# Patient Record
Sex: Female | Born: 1935 | Race: White | Hispanic: No | Smoking: Former smoker
Health system: Southern US, Community
[De-identification: ages and names within clinical notes are randomized; demographics above are authoritative.]

## PROBLEM LIST (undated history)

## (undated) DIAGNOSIS — I8390 Asymptomatic varicose veins of unspecified lower extremity: Secondary | ICD-10-CM

## (undated) HISTORY — DX: Asymptomatic varicose veins of unspecified lower extremity: I83.90

## (undated) HISTORY — PX: FRACTURE SURGERY: SHX138

## (undated) HISTORY — PX: JOINT REPLACEMENT: SHX530

---

## 1998-05-05 ENCOUNTER — Ambulatory Visit (HOSPITAL_COMMUNITY): Admission: RE | Admit: 1998-05-05 | Discharge: 1998-05-05 | Payer: Self-pay | Admitting: Obstetrics and Gynecology

## 1999-05-04 ENCOUNTER — Ambulatory Visit (HOSPITAL_COMMUNITY): Admission: RE | Admit: 1999-05-04 | Discharge: 1999-05-04 | Payer: Self-pay | Admitting: *Deleted

## 1999-05-04 ENCOUNTER — Encounter: Payer: Self-pay | Admitting: *Deleted

## 1999-07-07 ENCOUNTER — Ambulatory Visit (HOSPITAL_COMMUNITY): Admission: RE | Admit: 1999-07-07 | Discharge: 1999-07-07 | Payer: Self-pay | Admitting: *Deleted

## 1999-07-08 ENCOUNTER — Encounter: Payer: Self-pay | Admitting: *Deleted

## 1999-08-05 ENCOUNTER — Encounter (INDEPENDENT_AMBULATORY_CARE_PROVIDER_SITE_OTHER): Payer: Self-pay | Admitting: Specialist

## 1999-08-05 ENCOUNTER — Ambulatory Visit (HOSPITAL_COMMUNITY): Admission: RE | Admit: 1999-08-05 | Discharge: 1999-08-05 | Payer: Self-pay | Admitting: Gastroenterology

## 2000-05-25 ENCOUNTER — Ambulatory Visit (HOSPITAL_COMMUNITY): Admission: RE | Admit: 2000-05-25 | Discharge: 2000-05-25 | Payer: Self-pay

## 2000-07-12 ENCOUNTER — Encounter: Payer: Self-pay | Admitting: *Deleted

## 2000-07-12 ENCOUNTER — Encounter: Admission: RE | Admit: 2000-07-12 | Discharge: 2000-07-12 | Payer: Self-pay | Admitting: *Deleted

## 2001-01-10 ENCOUNTER — Other Ambulatory Visit: Admission: RE | Admit: 2001-01-10 | Discharge: 2001-01-10 | Payer: Self-pay | Admitting: Obstetrics and Gynecology

## 2001-04-18 ENCOUNTER — Encounter: Payer: Self-pay | Admitting: Obstetrics and Gynecology

## 2001-04-18 ENCOUNTER — Encounter: Admission: RE | Admit: 2001-04-18 | Discharge: 2001-04-18 | Payer: Self-pay | Admitting: Obstetrics and Gynecology

## 2001-05-29 ENCOUNTER — Encounter: Payer: Self-pay | Admitting: Internal Medicine

## 2001-05-29 ENCOUNTER — Encounter: Admission: RE | Admit: 2001-05-29 | Discharge: 2001-05-29 | Payer: Self-pay | Admitting: Unknown Physician Specialty

## 2002-02-01 ENCOUNTER — Other Ambulatory Visit: Admission: RE | Admit: 2002-02-01 | Discharge: 2002-02-01 | Payer: Self-pay | Admitting: Obstetrics and Gynecology

## 2002-05-14 ENCOUNTER — Encounter: Payer: Self-pay | Admitting: Internal Medicine

## 2002-05-14 ENCOUNTER — Encounter: Admission: RE | Admit: 2002-05-14 | Discharge: 2002-05-14 | Payer: Self-pay | Admitting: Internal Medicine

## 2002-05-17 ENCOUNTER — Encounter: Payer: Self-pay | Admitting: Gastroenterology

## 2002-05-17 ENCOUNTER — Ambulatory Visit (HOSPITAL_COMMUNITY): Admission: RE | Admit: 2002-05-17 | Discharge: 2002-05-17 | Payer: Self-pay | Admitting: Internal Medicine

## 2002-05-29 ENCOUNTER — Ambulatory Visit (HOSPITAL_COMMUNITY): Admission: RE | Admit: 2002-05-29 | Discharge: 2002-05-29 | Payer: Self-pay | Admitting: Neurosurgery

## 2002-05-29 ENCOUNTER — Encounter: Payer: Self-pay | Admitting: Neurosurgery

## 2002-06-03 ENCOUNTER — Encounter: Payer: Self-pay | Admitting: Neurosurgery

## 2002-06-03 ENCOUNTER — Ambulatory Visit (HOSPITAL_COMMUNITY): Admission: RE | Admit: 2002-06-03 | Discharge: 2002-06-03 | Payer: Self-pay | Admitting: Neurosurgery

## 2002-08-06 ENCOUNTER — Ambulatory Visit (HOSPITAL_COMMUNITY): Admission: RE | Admit: 2002-08-06 | Discharge: 2002-08-06 | Payer: Self-pay | Admitting: Unknown Physician Specialty

## 2003-07-28 ENCOUNTER — Encounter: Admission: RE | Admit: 2003-07-28 | Discharge: 2003-08-11 | Payer: Self-pay | Admitting: Internal Medicine

## 2003-08-08 ENCOUNTER — Ambulatory Visit (HOSPITAL_COMMUNITY): Admission: RE | Admit: 2003-08-08 | Discharge: 2003-08-08 | Payer: Self-pay | Admitting: Internal Medicine

## 2003-08-08 ENCOUNTER — Other Ambulatory Visit: Admission: RE | Admit: 2003-08-08 | Discharge: 2003-08-08 | Payer: Self-pay | Admitting: Internal Medicine

## 2003-08-14 ENCOUNTER — Encounter: Payer: Self-pay | Admitting: Internal Medicine

## 2003-08-14 ENCOUNTER — Encounter: Admission: RE | Admit: 2003-08-14 | Discharge: 2003-08-14 | Payer: Self-pay | Admitting: Internal Medicine

## 2004-08-18 ENCOUNTER — Encounter: Admission: RE | Admit: 2004-08-18 | Discharge: 2004-08-18 | Payer: Self-pay | Admitting: Internal Medicine

## 2004-09-23 ENCOUNTER — Ambulatory Visit (HOSPITAL_COMMUNITY): Admission: RE | Admit: 2004-09-23 | Discharge: 2004-09-23 | Payer: Self-pay | Admitting: Gastroenterology

## 2004-10-17 ENCOUNTER — Encounter: Admission: RE | Admit: 2004-10-17 | Discharge: 2004-10-17 | Payer: Self-pay | Admitting: Internal Medicine

## 2004-11-01 ENCOUNTER — Encounter: Admission: RE | Admit: 2004-11-01 | Discharge: 2004-11-01 | Payer: Self-pay | Admitting: Neurosurgery

## 2004-11-24 ENCOUNTER — Inpatient Hospital Stay (HOSPITAL_COMMUNITY): Admission: RE | Admit: 2004-11-24 | Discharge: 2004-11-28 | Payer: Self-pay | Admitting: Orthopedic Surgery

## 2005-02-14 ENCOUNTER — Encounter (INDEPENDENT_AMBULATORY_CARE_PROVIDER_SITE_OTHER): Payer: Self-pay | Admitting: *Deleted

## 2005-02-14 ENCOUNTER — Inpatient Hospital Stay (HOSPITAL_COMMUNITY): Admission: RE | Admit: 2005-02-14 | Discharge: 2005-02-18 | Payer: Self-pay | Admitting: Orthopedic Surgery

## 2005-02-14 ENCOUNTER — Ambulatory Visit: Payer: Self-pay | Admitting: Physical Medicine & Rehabilitation

## 2005-02-18 ENCOUNTER — Inpatient Hospital Stay
Admission: RE | Admit: 2005-02-18 | Discharge: 2005-02-26 | Payer: Self-pay | Admitting: Physical Medicine & Rehabilitation

## 2005-04-23 IMAGING — CR DG PELVIS 1-2V
1 series · 1 of 1 positions shown · non-contrast
Comparison: none

CLINICAL DATA: Osteoarthritis, right hip arthroplasty.
 PELVIS ONE VIEW:
 A view of the pelvis shows right total hip replacement in good position.  Degenerative changes in the left hip are noted.

[view not recorded]
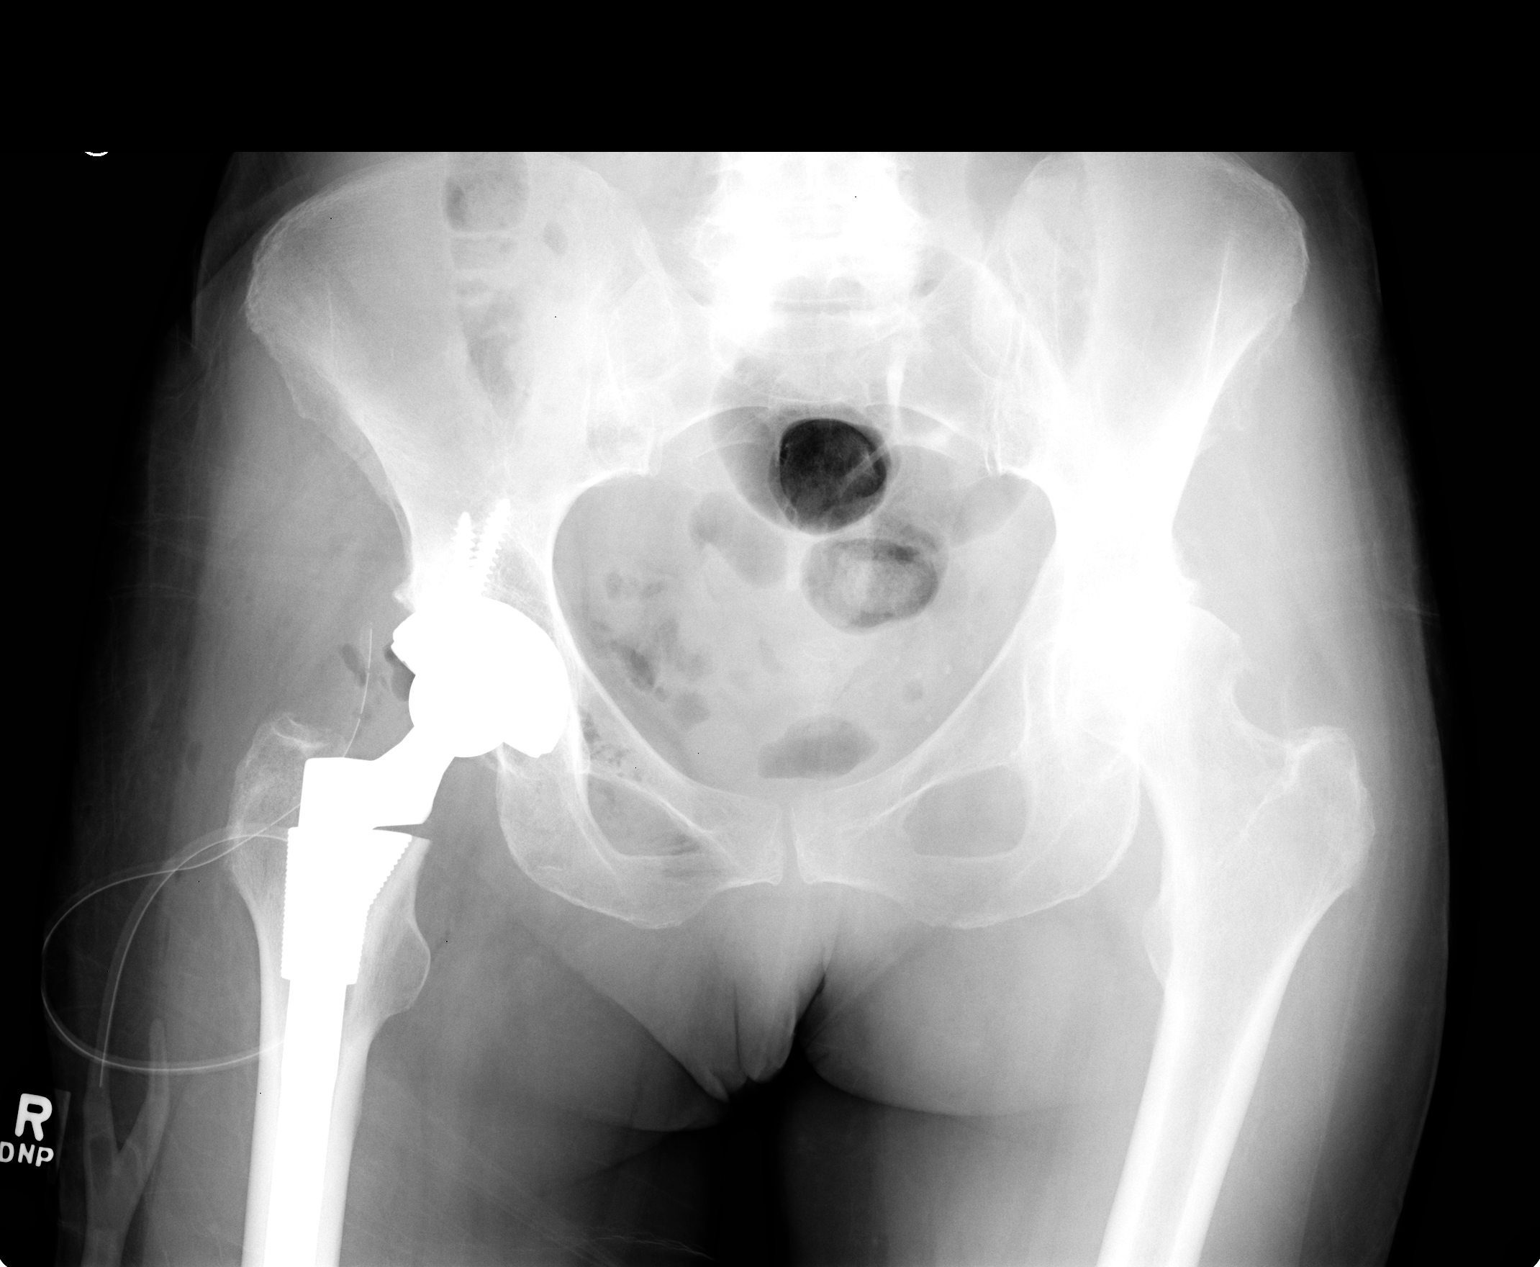

[1 of 1 positions shown; findings below may reference images not displayed]

IMPRESSION: Right total hip replacement in good position.

## 2005-08-10 ENCOUNTER — Other Ambulatory Visit: Admission: RE | Admit: 2005-08-10 | Discharge: 2005-08-10 | Payer: Self-pay | Admitting: Internal Medicine

## 2005-08-11 ENCOUNTER — Encounter: Admission: RE | Admit: 2005-08-11 | Discharge: 2005-08-11 | Payer: Self-pay | Admitting: Internal Medicine

## 2005-08-23 ENCOUNTER — Encounter: Admission: RE | Admit: 2005-08-23 | Discharge: 2005-08-23 | Payer: Self-pay | Admitting: Internal Medicine

## 2006-08-18 ENCOUNTER — Encounter: Admission: RE | Admit: 2006-08-18 | Discharge: 2006-08-18 | Payer: Self-pay | Admitting: *Deleted

## 2006-09-05 ENCOUNTER — Encounter: Admission: RE | Admit: 2006-09-05 | Discharge: 2006-09-05 | Payer: Self-pay | Admitting: Internal Medicine

## 2006-09-08 ENCOUNTER — Encounter: Admission: RE | Admit: 2006-09-08 | Discharge: 2006-09-08 | Payer: Self-pay | Admitting: Internal Medicine

## 2006-09-11 ENCOUNTER — Inpatient Hospital Stay (HOSPITAL_COMMUNITY): Admission: RE | Admit: 2006-09-11 | Discharge: 2006-09-14 | Payer: Self-pay | Admitting: Orthopedic Surgery

## 2006-09-12 ENCOUNTER — Ambulatory Visit: Payer: Self-pay | Admitting: Physical Medicine & Rehabilitation

## 2007-07-13 ENCOUNTER — Encounter: Admission: RE | Admit: 2007-07-13 | Discharge: 2007-07-13 | Payer: Self-pay | Admitting: Neurosurgery

## 2007-09-11 ENCOUNTER — Encounter: Admission: RE | Admit: 2007-09-11 | Discharge: 2007-09-11 | Payer: Self-pay | Admitting: Internal Medicine

## 2008-02-14 ENCOUNTER — Emergency Department (HOSPITAL_COMMUNITY): Admission: EM | Admit: 2008-02-14 | Discharge: 2008-02-14 | Payer: Self-pay | Admitting: Family Medicine

## 2008-09-12 ENCOUNTER — Encounter: Admission: RE | Admit: 2008-09-12 | Discharge: 2008-09-12 | Payer: Self-pay

## 2008-09-18 ENCOUNTER — Encounter: Admission: RE | Admit: 2008-09-18 | Discharge: 2008-09-18 | Payer: Self-pay | Admitting: Internal Medicine

## 2008-09-19 ENCOUNTER — Encounter: Admission: RE | Admit: 2008-09-19 | Discharge: 2008-09-19 | Payer: Self-pay | Admitting: Neurosurgery

## 2009-08-26 ENCOUNTER — Inpatient Hospital Stay (HOSPITAL_COMMUNITY): Admission: EM | Admit: 2009-08-26 | Discharge: 2009-08-30 | Payer: Self-pay | Admitting: Emergency Medicine

## 2009-08-27 ENCOUNTER — Encounter (INDEPENDENT_AMBULATORY_CARE_PROVIDER_SITE_OTHER): Payer: Self-pay | Admitting: Internal Medicine

## 2009-12-01 ENCOUNTER — Encounter: Admission: RE | Admit: 2009-12-01 | Discharge: 2009-12-01 | Payer: Self-pay | Admitting: Internal Medicine

## 2010-08-19 ENCOUNTER — Ambulatory Visit (HOSPITAL_COMMUNITY): Admission: RE | Admit: 2010-08-19 | Discharge: 2010-08-19 | Payer: Self-pay | Admitting: Gastroenterology

## 2010-12-02 ENCOUNTER — Encounter
Admission: RE | Admit: 2010-12-02 | Discharge: 2010-12-02 | Payer: Self-pay | Source: Home / Self Care | Admitting: Internal Medicine

## 2010-12-23 ENCOUNTER — Other Ambulatory Visit
Admission: RE | Admit: 2010-12-23 | Discharge: 2010-12-23 | Payer: Self-pay | Source: Home / Self Care | Admitting: Obstetrics and Gynecology

## 2011-01-15 ENCOUNTER — Encounter: Payer: Self-pay | Admitting: Neurosurgery

## 2011-01-16 ENCOUNTER — Encounter: Payer: Self-pay | Admitting: Internal Medicine

## 2011-01-17 ENCOUNTER — Encounter: Payer: Self-pay | Admitting: Internal Medicine

## 2011-04-01 LAB — BASIC METABOLIC PANEL
BUN: 4 mg/dL — ABNORMAL LOW (ref 6–23)
BUN: 5 mg/dL — ABNORMAL LOW (ref 6–23)
BUN: 6 mg/dL (ref 6–23)
BUN: 9 mg/dL (ref 6–23)
CO2: 30 mEq/L (ref 19–32)
CO2: 31 mEq/L (ref 19–32)
CO2: 31 mEq/L (ref 19–32)
CO2: 32 mEq/L (ref 19–32)
CO2: 32 mEq/L (ref 19–32)
Calcium: 7.9 mg/dL — ABNORMAL LOW (ref 8.4–10.5)
Calcium: 8.1 mg/dL — ABNORMAL LOW (ref 8.4–10.5)
Calcium: 8.1 mg/dL — ABNORMAL LOW (ref 8.4–10.5)
Chloride: 97 mEq/L (ref 96–112)
Chloride: 97 mEq/L (ref 96–112)
Creatinine, Ser: 0.54 mg/dL (ref 0.4–1.2)
Creatinine, Ser: 0.57 mg/dL (ref 0.4–1.2)
Creatinine, Ser: 0.58 mg/dL (ref 0.4–1.2)
Creatinine, Ser: 0.61 mg/dL (ref 0.4–1.2)
Creatinine, Ser: 0.64 mg/dL (ref 0.4–1.2)
GFR calc Af Amer: >60 mL/min (ref >60)
GFR calc non Af Amer: >60 mL/min (ref >60)
Glucose, Bld: 131 mg/dL — ABNORMAL HIGH (ref 70–99)
Glucose, Bld: 138 mg/dL — ABNORMAL HIGH (ref 70–99)
Glucose, Bld: 156 mg/dL — ABNORMAL HIGH (ref 70–99)
Sodium: 137 mEq/L (ref 135–145)

## 2011-04-01 LAB — CBC
HCT: 25.1 % — ABNORMAL LOW (ref 36.0–46.0)
Hemoglobin: 10.1 g/dL — ABNORMAL LOW (ref 12.0–15.0)
MCHC: 34.4 g/dL (ref 30.0–36.0)
MCHC: 34.4 g/dL (ref 30.0–36.0)
MCV: 93.4 fL (ref 78.0–100.0)
MCV: 93.9 fL (ref 78.0–100.0)
Platelets: 148 10*3/uL — ABNORMAL LOW (ref 150–400)
Platelets: 158 10*3/uL (ref 150–400)
RBC: 3.17 MIL/uL — ABNORMAL LOW (ref 3.87–5.11)
RDW: 13.7 % (ref 11.5–15.5)
RDW: 13.8 % (ref 11.5–15.5)
WBC: 5.5 10*3/uL (ref 4.0–10.5)

## 2011-04-01 LAB — MAGNESIUM: Magnesium: 2.1 mg/dL (ref 1.5–2.5)

## 2011-04-02 LAB — CROSSMATCH: ABO/RH(D): A POS

## 2011-04-02 LAB — URINALYSIS, ROUTINE W REFLEX MICROSCOPIC
Glucose, UA: NEGATIVE mg/dL
Hgb urine dipstick: NEGATIVE
Protein, ur: NEGATIVE mg/dL
Specific Gravity, Urine: 1.014 (ref 1.005–1.030)
Urobilinogen, UA: 0.2 mg/dL (ref 0.0–1.0)

## 2011-04-02 LAB — SAMPLE TO BLOOD BANK

## 2011-04-02 LAB — CBC
HCT: 39.1 % (ref 36.0–46.0)
Hemoglobin: 13.1 g/dL (ref 12.0–15.0)
MCHC: 33.5 g/dL (ref 30.0–36.0)
Platelets: 220 10*3/uL (ref 150–400)
RDW: 13.3 % (ref 11.5–15.5)

## 2011-04-02 LAB — DIFFERENTIAL
Basophils Relative: 0 % (ref 0–1)
Lymphocytes Relative: 19 % (ref 12–46)
Lymphs Abs: 1.3 10*3/uL (ref 0.7–4.0)
Monocytes Relative: 10 % (ref 3–12)
Neutro Abs: 4.8 10*3/uL (ref 1.7–7.7)
Neutrophils Relative %: 70 % (ref 43–77)

## 2011-04-02 LAB — COMPREHENSIVE METABOLIC PANEL
Albumin: 3.7 g/dL (ref 3.5–5.2)
BUN: 11 mg/dL (ref 6–23)
Calcium: 8.9 mg/dL (ref 8.4–10.5)
Creatinine, Ser: 0.65 mg/dL (ref 0.4–1.2)
Glucose, Bld: 135 mg/dL — ABNORMAL HIGH (ref 70–99)
Total Protein: 6.9 g/dL (ref 6.0–8.3)

## 2011-04-02 LAB — URINE CULTURE

## 2011-04-02 LAB — PROTIME-INR: INR: 1 (ref 0.00–1.49)

## 2011-04-02 LAB — APTT: aPTT: 33 seconds (ref 24–37)

## 2011-05-10 NOTE — Op Note (Signed)
[name], [name]                  ACCOUNT NO.:  [id number]   MEDICAL RECORD NO.:  [id number]          PATIENT TYPE:  INP   LOCATION:  1332                         FACILITY:  [facility]   PHYSICIAN:  Ollen Gross, M.D.    DATE OF BIRTH:  1936/03/06   DATE OF PROCEDURE:  08/26/2009  DATE OF DISCHARGE:                               OPERATIVE REPORT   PREOPERATIVE DIAGNOSIS:  Left periprosthetic femur fracture.   POSTOPERATIVE DIAGNOSIS:  Left periprosthetic femur fracture.   PROCEDURE:  Open reduction, internal fixation left periprosthetic femur  fracture.   SURGEON:  Dr. Lequita Halt.   ASSISTANT:  Avel Peace, PA-C.   ANESTHESIA:  General.   ESTIMATED BLOOD LOSS:  500.   DRAIN:  Hemovac x1.   COMPLICATIONS:  None.   CONDITION:  Stable to recovery.   BRIEF CLINICAL NOTE:  Ms. Barga is a 75 year old female who has had a  previous left total hip arthroplasty and left total knee arthroplasty  approximately 4-5 years ago.  She had no problems whatsoever, but then  yesterday was walking up some steps to get to her deck and fell,  twisting her left thigh with immediate pain and deformity.  She had a  periprosthetic fracture, spiral, oblique in nature between her hip and  knee prosthesis.  It did not involve the interfaces of either  prosthesis.  She presents now for open reduction, internal fixation.   PROCEDURE IN DETAIL:  After the successful administration of general  anesthetic, the patient was placed in the right lateral decubitus  position with the left side up and held with the hip positioner.  The  left lower extremity was isolated from her perineum with plastic drapes  and prepped and draped in the usual sterile fashion.  An incision was  made along the lateral aspect of the thigh starting about 5 inches above  the knee and coming to about 5 inches below the hip.  The skin was cut  with a 10 blade through subcutaneous tissue to the fascia lata which was  incised in line with  the skin incision.  The fascia and the vastus  lateralis was incised with cautery.  We elevated the muscle off  posterior and split through the fibers of the muscle to get to the  femoral fracture.  It was oblique with a long butterfly fragment.  I  used fracture reduction clamps to anatomically reduce the fracture after  pulling it out to length with my assistant pulling traction.  We had an  anatomic reduction with the clamps and then placed two Synthes stainless  steel cables around the femur as cerclage cables and then tightened up  the cables utilizing the tightening device through the clamp and then  clamped them off and the reduction was maintained.  We then used a 14-  hole Synthes cable plate and put it on the lateral cortex of the femur.  I placed three cortical screws distal to the fracture and then two  cortical and several locking screws proximal to the fracture.  We had  great purchase with these screws.  I then placed a locking screw through  the distal-most hole in bicortical fashion.  C-arm pictures AP and  lateral showed anatomic reduction of the fracture with adequate cortical  fixation above and below the fracture site.  I put a few more locking  screws up proximally.  These were unicortical locking screws as they  were at the level where the hip prosthesis was.  We then thoroughly  irrigated the wound bed and closed the fascia of the vastus lateralis  with running #1 Vicryl.  The fascia lata was closed with #1 Vicryl over  a Hemovac drain.  The subcu closed with interrupted 2-0 Vicryl and skin  with staples.  The drain was hooked to suction.  The incision cleaned  and dried and a bulky sterile dressing applied.  She was placed into a  knee immobilizer, awakened and transported to recovery in stable  condition.      Ollen Gross, M.D.  Electronically Signed     FA/MEDQ  D:  08/26/2009  T:  08/27/2009  Job:  [id number]

## 2011-05-10 NOTE — Consult Note (Signed)
NAMEJADY, [name]                  ACCOUNT NO.:  [id number]   MEDICAL RECORD NO.:  [id number]          PATIENT TYPE:  INP   LOCATION:  1332                         FACILITY:  [facility]   PHYSICIAN:  Richarda Overlie, MD       DATE OF BIRTH:  05-03-1936   DATE OF CONSULTATION:  08/26/2009  DATE OF DISCHARGE:                                 CONSULTATION   REASON FOR CONSULTATION:  Preoperative clearance.   SUBJECTIVE:  This is a 75 year old female who presents to the ER with a  chief complaint of injury to her left lower extremity.  The patient was  outside on her deck in the dark and slipped and fell, injuring her left  hip. No history of angina, she does free water exercises at the [facility]  without any cardiopulmonary symtoms . She denied any loss of  consciousness.  Denied any dizziness, syncope, presyncope episodes.  No  prior history of seizure disorder.  The patient was found to have an  acute oblique fracture of the left femoral diaphysis inferior to the  stem of her hip arthroplasty and superior to her knee arthroplasty.  The  patient denied any chest pain, palpitations, shortness of breath,  dizziness, slurred speech, TIA, unilateral weakness, or UTI symptoms  prior to the fall.  In the ER she was found to be hemodynamically  stable.   PAST MEDICAL HISTORY:  Hypertension.  Rheumatoid arthritis.  Hemorrhoids.  History of aneurysm behind the left eye.  Degenerative  disk disease.  Osteoarthritis.  Occipital neuralgia secondary to  aneurysm.  Coronary artery disease.  Fibrocystic breast disease.  Urinary incontinence.   PAST SURGICAL HISTORY:  Tubal ligation.  Multiple breast biopsies.  Nasal surgery.  Left knee arthroscopy.  Surgical fixation.  Dental  implant.  Colonoscopy.  Right total hip arthroplasty.  Left to hip  arthroplasty.   SOCIAL HISTORY:  She is divorced and retired.  Past smoker about 30  years ago, quit in 1991.  No alcohol.  She has 1 son.   FAMILY HISTORY:  Mother  with a history of hypertension and arthritis.  Father with a history of arthritis and stroke.  She does have a sister  with a history of breast cancer.  Another sister with a history of lung  cancer.   REVIEW OF SYSTEMS:  As documented in the HPI.   LABORATORY STUDIES:  Sodium 135, potassium 3.1, chloride 95, bicarb 31,  glucose 131, BUN 5, creatinine 0.57, magnesium 2.2.  INR 1.0, urinalysis  negative.  Hemoglobin 13.1, hematocrit 39.1.  Chest x-ray shows probable  COPD.  No acute lung disease.  Femur XR as documented above.   PHYSICAL EXAMINATION:  VITAL SIGNS:  Temperature 98.1, pulse 70,  respirations 18, blood pressure 130/85, 100% on 2L.  GENERAL:  The patient appeared to be alert and oriented, currently  comfortable.  No acute distress.  HEENT:  Pupils equal, round, and reactive to light.  Extraocular  movements intact.  Normocephalic, atraumatic.  Sclerae intact.  NECK:  Supple without any JVD.  CARDIOVASCULAR:  Regular rate and rhythm.  No appreciable murmur, rub,  or gallop.  ABDOMEN:  Soft, nontender, nondistended.  EXTREMITIES:  Neurovascularly intact, except for deformity of her left  mid thigh.  NEUROLOGIC:  Cranial nerves 2-12 grossly intact.  PSYCH:  Appropriate mood and affect.   DIAGNOSTIC STUDIES:  EKG shows 74 beats per minute.  Normal axis.   CURRENT MEDICATIONS:  1. Cefazolin 1 g.  2. Neurontin 300 mg p.o. twice a day.  3. Dilaudid 2 mg IV p.r.n.  4. Morphine sulfate 25 mg IV q.4 hours p.r.n.  5. Zofran 4 mg IV q.4 hours p.r.n.  6. K-Dur 10 mEq IV q.1 hour x4 bags.  7. Benadryl 12.5 mg q.6 IV p.r.n.  8. Robaxin 500 mg IV q.8 p.r.n.   ASSESSMENT AND PLAN:  A 75 year old female with a history of chronic  obstructive pulmonary disease, carotid artery disease, hypertension, who  presents to the emergency room with a fall and a fracture.  Preoperative  risk stratification is requested.  The patient currently is already  going to surgery.   As far as risk  stratification is concerned, I do not see any obvious  contraindications to surgery from a cardiac standpoint as the patient  does not have any underlying arrhythmia or history of congestive heart  failure.  No history of unstable angina.  No history of valvular heart  disease.  The patient denies any history of ongoing shortness of breath  or exertional shortness of breath, or any chest pain symptoms.  Her exam  is nonfocal.  Therefore, do not feel that the patient has any obvious  contraindications to surgery, but she is still an intermediate risk for  a nonemergent cardiac surgery.     I would preoperatively check her electrolytes and replete potassium  before she goes into surgery to make sure she does not develop any  intraoperative or postoperative arrhythmias.  A 2D echo for evaluation.  Incentive spirometry plus DVT prophylaxis as planned by the orthopedic  service.  Will continue to follow the patient throughout her  hospitalization.      Richarda Overlie, MD  Electronically Signed     NA/MEDQ  D:  08/26/2009  T:  08/26/2009  Job:  [id number]

## 2011-05-13 NOTE — Discharge Summary (Signed)
[name], RAU                  ACCOUNT NO.:  [id number]   MEDICAL RECORD NO.:  [id number]          PATIENT TYPE:  INP   LOCATION:  0452                         FACILITY:  [facility]   PHYSICIAN:  Ollen Gross, M.D.    DATE OF BIRTH:  [birth year]   DATE OF ADMISSION:  11/24/2004  DATE OF DISCHARGE:  11/28/2004                                 DISCHARGE SUMMARY   ADMISSION DIAGNOSES:  1.  Bilateral lower hip osteoarthritis, right more symptomatic than left.  2.  Hypertension.  3.  Asymptomatic hemorrhoids.  4.  Degenerative disk disease.   POSTOPERATIVE DIAGNOSIS:  1.  Osteoarthritis, right hip, status post right total arthroplasty.  2.  Osteoarthritis, left hip.  3.  Postoperative blood-loss anemia that required transfusion.  4.  Postoperative hypokalemia, improved.  5.  Postoperative hyponatremia, improved.  6.  Hypertension.  7.  Asymptomatic hemorrhoids.  8.  Degenerative disk disease.   PROCEDURE:  On November 24, 2004:  Right total arthroplasty.   SURGEON:  Ollen Gross, M.D.   ASSISTANT:  Alexzandrew L. Perkins, P.A.-C.   ANESTHESIA:  General.   BLOOD LOSS:  600 cc.   DRAINS:  Hemovac x1.   CONSULTS:  None.   BRIEF HISTORY:  Ms. Mander is a 75 year old female with severe end-stage  arthritis of both hips, progressively getting worse to the stage where she  can barely walk.  Presents now for right total hip arthroplasty.  The right  is more symptomatic than the left.   LABORATORY DATA:  Preop CBC showed a hemoglobin of 12.8, hematocrit 37.5.  Differential shows monos of 12, otherwise negative.  Postop hemoglobin 10.4.  Last noted H&H 9.3 and 27.9.  PT/PTT preop was 12.8 and 41, respectively.  An INR of 1.  Serial pro times followed.  Last noted PT/INR of 18.1 and 1.8.  Chem panel on admission showed a low sodium of 134, low chloride 94, low  albumin 3.  Mini-chem panel within normal limits.  Serial BMETs are  followed.  Sodium did drop down to 129, back up to  133.  Potassium dropped  down to 2.8, back up to 3.2.  Calcium dropped from 9.3 to 8.2.  Urinalysis  preop negative.  Blood group type A+.   EKG dated on November 22, 2004:  Normal sinus rhythm.  Probably anterior  infarct, age undetermined.  Inferior infarct, age undetermined.  No old  tracing to compare.  Performed by Dr. [place] Bing.   Right hip films on November 22, 2004:  Severe osteoarthritis of the right  hip.   Pelvis film on November 22, 2004:  Severe arthritis of both hips.   A two-view chest on November 22, 2004:  No evidence of acute disease.   HOSPITAL COURSE:  Patient admitted to [facility] and taken to the  OR.  Underwent the above procedure and was later transferred to the recovery  room and to the orthopedic floor.  Placed on PCA and p.o. analgesics for  pain control following surgery.  Did fairly well.  On the morning of day #1,  started  to turn and get up in the bed.  Was moved up to 4 [place] on day #1.   By day #2, her groin pain was gone.  She was in great spirits.  Did have a  little incisional pain, which was expected.  It was felt due to her great  progress within less than 48 hours, that she would do quite well and  probably would be ready to go home over the weekend.  She did have some drop  in her sodium and potassium and was placed on potassium supplements.  The  fluids were discontinued.  The BMET was rechecked.  Hemoglobin had a little  bit of drop from 10.4 to 9.5.  Prescriptions were placed on the chart due to  the possibility of going home.  She was seen over the weekend and continued  to improve.  Her hyponatremia was improving, and hypokalemia did respond to  potassium supplements.  Continued to do well with her therapy.  Dressing  change was initiated on postop day #2.  Incision was healing well.  By day  #4, she was resting comfortably, tolerating her meds well, progressing with  her therapy, and was discharged home.   DISCHARGE  PLAN:  1.  Patient was discharged home on November 28, 2004.  2.  Discharge diagnoses:  Please see above.  3.  Discharge meds:  Percocet, Robaxin, Coumadin.  4.  Diet as tolerated.  5.  Activity:  Partial weightbearing of 25-50%, right lower extremity.      Continue gait training, ambulation, and ADLs.  Total hip protocol.  6.  Follow up in two weeks from surgery.  Contact the office for an      appointment.   DISPOSITION:  Home.   CONDITION ON DISCHARGE:  Improved.      ALP/MEDQ  D:  01/18/2005  T:  01/18/2005  Job:  [id number]
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
## 2011-05-13 NOTE — Discharge Summary (Signed)
NAMEAMALYA, [name] NO.:  [id number]   MEDICAL RECORD NO.:  [id number]          PATIENT TYPE:  ORB   LOCATION:  4527                         FACILITY:  [facility]   PHYSICIAN:  Ollen Gross, M.D.    DATE OF BIRTH:  August 14, 1936   DATE OF ADMISSION:  02/18/2005  DATE OF DISCHARGE:  02/26/2005                                 DISCHARGE SUMMARY   ADMISSION DIAGNOSES:  1.  Osteoarthritis, left hip.  2.  Hypertension.  3.  Hemorrhoids.  4.  Arterial aneurysm, left eye.  5.  Occipital neuralgia.  6.  Degenerative disk disease.  7.  Osteoarthritis.  8.  Fibrocystic breast disease.  9.  Postmenopausal.   DISCHARGE DIAGNOSES:  1.  Osteoarthritis, left hip, status post left total hip replacement      arthroplasty, metal-on-metal construct.  2.  Mild postoperative blood loss anemia.  3.  Status post transfusion without sequelae.  4.  Postoperative hyponatremia improved.  5.  Hypokalemia, improved.  6.  Hypertension.  7.  Hemorrhoids.  8.  Arterial aneurysm, left eye.  9.  Occipital neuralgia.  10. Degenerative disk disease.  11. Osteoarthritis.  12. Fibrocystic breast disease.  13. Postmenopausal   PROCEDURE:  Left total hip, left total hip arthroplasty, metal-on-metal  construct.  Surgeon:  Dr. Lequita Halt.  Assistant:  Alexzandrew L. Julien Girt, P.A.-  C.  Anesthesia:  General.  Blood loss:  800 mL.  Hemovac drain x1.   BRIEF HISTORY:  [name] is a 75 year old female with severe  osteoarthritis of the left hip with intractable pain.  She has had end-stage  changes of both hips, very successful right total hip, approximately three  months and now presents for left total hip.   CONSULTATIONS:  Rehabilitation services, Dr. Ellwood Dense.   LABORATORY DATA:  CBC on admission showed hemoglobin 12.5, hematocrit 38.6,  white cell count 5.5, normal differential.  Hemoglobin dropped  postoperatively down to 8.1.  She was given blood.  Hemoglobin 9.8, last  hemoglobin  9.5, hematocrit 27.9.  PT/PTT preoperatively 12.8 and 37  respectively.  INR 1.  Serial pro times followed. Last PT/INR 19.8 and 2.1.  Chem panel on admission, low sodium 134, low potassium at 3.3.  Low chloride  95.  Remaining chem panel all within normal limits.  Serial BMETs were  followed.  Sodium came up to 137, back down to 130, back up to 134.  Potassium came up a normal of 4.1, last noted at 3.5.  Calcium dropped from  9.5 to 7.9, back up to 8.2.  Urinalysis preoperatively cloudy; otherwise  negative.  Blood group type A positive.   The EKG dated November 22, 2004 showed normal sinus rhythm, probably  anterior infarct, age undetermined, inferior infarct, age undetermined. No  old tracing to compare.  Refer to Dr.  Bing.  Left hip films on  February 08, 2005 show marked osteoarthritis of the left hip.  Portable  pelvis and left hip films on February 14, 2005, satisfactory left total hip  replacement.   HOSPITAL COURSE:  Admitted to [facility].  Taken to the OR.  Underwent above procedure without complications.  Tolerated the procedure  well.  The patient was placed on PCMP and Wygesic for pain control following  surgery.  She was noted to have a drop in her hemoglobin down to 8.1 on day  #1.  Due to the drop, it was felt that she would benefit from undergoing  transfusion.  This was discussed at length with the patient.  She was okay  with this.  She was given blood post surgery and hemoglobin responded well.  She was back up to 9.8.  She also underwent rehabilitation consult and it  was felt that she would be appropriate for inpatient SACU stay and would be  transferred once a bed became available.  By day #2, she started to progress  well.  She had already been up ambulating 80 feet, then got up to 90 feet by  day #3.  Day #2 dressing was changed and incision looked very good.  She had  a little bit of blistering posterior to the dressing.  This was covered  with  Tegaderm.  Due to the patient's social status, it was felt she still would  benefit by going to inpatient rehab SACU so it would be decided she would  transfer once a bed because available.  She continued to receive therapy and  noted on February 18, 2005 that a bed did become available over a SACU.  The  patient was in agreement.  It was discussed. She was transferred at that  time.   DISCHARGE PLAN:  1.  The patient transferred to [facility].  2.  Discharge diagnoses:  Please seen above.  3.  Discharge medications:  Continue current medications as per the [facility].      Will be sent over with the patient to the SACU.  4.  Diet:  Continue current diet.  5.  Activity:  Partial weightbearing 25-50% left lower extremity.  Gait      training, strenghtening and ADLs.  Total hip protocol per PT/OT on the      SACU.  6.  Follow up two weeks from surgery or following discharge from the unit.  7.  Condition on discharge:  Improved.      ALP/MEDQ  D:  03/02/2005  T:  03/02/2005  Job:  [id number]   cc:   Georgann Housekeeper, MD  [address]  [place]  [place] [address]  Fax: [phone]
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
## 2011-05-13 NOTE — H&P (Signed)
[name], [name] NO.:  [id number]   MEDICAL RECORD NO.:  [id number]          PATIENT TYPE:  INP   LOCATION:  NA                           FACILITY:  [facility]   PHYSICIAN:  Ollen Gross, M.D.    DATE OF BIRTH:  [birth year]   DATE OF ADMISSION:  11/24/2004  DATE OF DISCHARGE:                                HISTORY & PHYSICAL   CHIEF COMPLAINT:  Bilateral hip pain.   HISTORY OF PRESENT ILLNESS:  The patient is a 75 year old female who has  been seen by Dr. Homero Fellers Aluisio in second opinion for ongoing hip pain.  She  is noted to have bilateral hip pain.  The right hip seems to be more  symptomatic and problematic than the left.  It has been ongoing for about a  year now.  She has also been treated for back pain which she has undergone  some epidural steroid injections which did not lead to significant  improvement.  She underwent a MRI which did show some severe degenerative  changes in her hips.  Subsequently, we sent over a second opinion by Dr.  Ollen Gross.  X-rays taken at that time do show severe end-stage arthritis  with bone on bone in the right hip and lateral subluxation of the femoral  head.  Left hip is also noted to be bone on bone.  It is felt that her  significant pain is coming from her hips.  It is felt that she would benefit  from undergoing hip replacement.  Risks and benefits have been discussed and  she elected to proceed with surgery.   ALLERGIES:  No known drug allergies.   CURRENT MEDICATIONS:  1.  Reserpine 0.25 mg 1/2 tablet q.d.  2.  Hydrochlorothiazide 50 mg 1 tablet q.d.  3.  Neurontin 300 mg b.i.d.  4.  Allegra 60 mg 12 hour release tablet b.i.d.  5.  Beconase nasal spray 50 mcg 1-2 sprays q.d.  6.  Theradex 0.12% b.i.d.  7.  Mobic 7.5 mg q.d.  8.  Diazepam 5 mg nightly.   PAST MEDICAL HISTORY:  1.  Hypertension.  2.  Hemorrhoids.  3.  Arterial aneurysm behind the left eye.  4.  Degenerative disc disease.  5.   Osteoarthritis.   PAST SURGICAL HISTORY:  1.  Tubal ligation.  2.  She has undergone five breast biopsies, all benign.  3.  Nasal surgery.  4.  Left knee arthroscopic procedure.  5.  Surgery to correct aneurysm behind left eye.  6.  Dental implants in 1998.  7.  She has also had a normal colonoscopy recently.   SOCIAL HISTORY:  Divorced, retired.  Previous past smoker about 30 years,  quit in 1991.  No alcohol.  Has one son.  She does have family members.  We  will assist with her care after surgery.   FAMILY HISTORY:  Mother with history of hypertension and arthritis.  Father  with history of arthritis and stroke.  She does have a sister with a history  of breast cancer.  Another sister with  a history of lung cancer.  Two  brothers, both deceased and both with heart disease.   REVIEW OF SYMPTOMS:  GENERAL:  No fevers, chills, night sweats.  NEUROLOGICAL:  No seizures, syncope, or paralysis.  RESPIRATORY:  No  shortness of breath, productive cough, or hemoptysis.  CARDIOVASCULAR:  No  chest pain, angina, orthopnea.  GASTROINTESTINAL:  No nausea, vomiting,  diarrhea, or constipation.  GENITOURINARY:  No dysuria or hematuria.  Does  have some frequency and some nocturia.  MUSCULOSKELETAL:  Pertinent of that  the hip found in the history of present illness.   PHYSICAL EXAMINATION:  VITAL SIGNS:  Pulse 84, respirations 12, blood  pressure 108/75.  GENERAL:  A 75 year old thin, slender framed female.  She is accompanied by  her sister.  She is alert, oriented, and cooperative.  Very pleasant at time  of exam.  Appears to be a good historian.  HEENT:  Normocephalic and atraumatic.  Pupils are equal, round and reactive.  EOMs intact.  NECK:  Supple.  No carotid bruits.  Oropharynx is clear.  CHEST:  Clear anterior and posterior.  No rhonchi, rales, or wheezing.  HEART:  Regular rate and rhythm.  No murmurs.  S1 and S2 noted.  ABDOMEN:  Soft, flat, and nontender.  Bowel sounds are  presented.  BREASTS:  Not done, not pertinent to present illness.  RECTAL:  Not done, not pertinent ot present illness.  GENITALIA:  Not done, not pertinent to present illness.  EXTREMITIES:  Right hip shows hip flexion of only 70 degrees, full  extension, 0 internal rotation, only 5 degrees of external rotation, and  only 10 degrees of abduction.  Left hip shows flexion up to 90 degrees.  Full extension, 5 degrees of internal rotation, and 15 degrees of external  rotation, and 20 degrees of abduction.  The right leg appears to be a 1/2  inch shorter than the left.   IMPRESSION:  1.  Bilateral hips osteoarthritis right more symptomatic than the left.  2.  Hypertension.  3.  Hemorrhoids, asymptomatic.  4.  Degenerative disc disease.   PLAN:  The patient will be admitted to [facility] to undergo a  right total hip arthroplasty.  Surgery will be performed by Dr. Ollen Gross.  The patient's medical doctor is Dr. Georgann Housekeeper.  Dr. Georgann Housekeeper will be notified of the admission and be consulted if needed for any  medical assistance with the patient throughout the hospital course.     [name]   ALP/MEDQ  D:  11/23/2004  T:  11/23/2004  Job:  [id number]   cc:   Georgann Housekeeper, MD  [address]  [place]  [place] [address]  Fax: [phone]

## 2011-05-13 NOTE — Op Note (Signed)
NAMECURSTIN, SCHMALE NO.:  [id number]   MEDICAL RECORD NO.:  [id number]          PATIENT TYPE:  INP   LOCATION:  0006                         FACILITY:  [facility]   PHYSICIAN:  Ollen Gross, M.D.    DATE OF BIRTH:  [birth year]   DATE OF PROCEDURE:  11/24/2004  DATE OF DISCHARGE:                                 OPERATIVE REPORT   PREOPERATIVE DIAGNOSIS:  Osteoarthritis, right hip.   POSTOPERATIVE DIAGNOSIS:  Osteoarthritis, right hip.   PROCEDURE:  Right total hip arthroplasty.   SURGEON:  Ollen Gross, M.D.   ASSISTANT:  Avel Peace, PA-C.   ANESTHESIA:  General.   ESTIMATED BLOOD LOSS:  600.   DRAINS:  Hemovac x1.   COMPLICATIONS:  None.   CONDITION:  Stable to the recovery room.   CLINICAL NOTE:  Ms. Holquin is a 75 year old female who had severe end-stage  arthritis at both hips with progressively worsening intractable pain.  She  is at a stage where she can barely walk.  She presents now for right total  hip arthroplasty, as the right side is more symptomatic.   PROCEDURE IN DETAIL:  After successful administration of general anesthetic,  the patient is placed in the left lateral decubitus position with the right  side up and held with a hip positioner.  The right lower extremity was  isolated from her perineum with plastic drapes and prepped and draped in the  usual sterile fashion.  A mini posterolateral incision was made with a 10  blade through the subcutaneous tissue to the level of the fascia lata, which  was incised in line with the skin incision.  The sciatic nerve was palpated  and protected, and short rotators were isolated off the femur.  A  capsulectomy was performed.  She had a tremendous amount of synovitis  present and a tremendous amount of hypertrophic synovium as well as  redundant capsule.  The capsulectomy is performed, and the hip is  dislocated.  The head is severely flattened, and cartilage had peeled off  the head.  The  center of the head is marked, and a trial prosthesis is  placed such that the center of the trial head corresponds to the center of  the native femoral head.  Osteotomy line is marked on the femoral neck and  osteotomy made with an oscillating saw.  The femoral head is removed, and  then the femur retracted anteriorly to gain acetabular exposure.  Acetabular  labrum and synovium are removed.  The reaming starts at 45, coursing in  increments up to a 49 and then a 50 mm Pinnacle acetabular shell is placed  in anatomic position and transfixed with two dome screws.  Her acetabulum  was somewhat shallow, consistent with some dysplasia, thus, there was a  small amount of superior uncovering in order to get it at the appropriate  inclination.  We had a great Press-Fit, and again, we transfixed it with two  dome screws with excellent purchase.  The trial 28 mm neutral liner was  placed.   Femoral preparation begins with  a canal finder, and the canal is irrigated.  Axial reaming starts at 10, proceeding up to 13.5 and then a proximal  reaming is performed up to an 18D, and the sleeve machine to a small.  An  18D small trial sleeve is placed with an 18 x 13 stem and a 36+8 neck.  Her  version is neutral, thus, we placed this in approximately 20 degrees of  anteversion to get more appropriate anatomy and better stability.  The 28+0  head is placed, and with the 28+0, a little soft tissue laxity.  She still  had great stability, but given the soft tissue laxity, it went up to 28.6,  which had more appropriate tension.  We were able to get full extension and  full external rotation, 70 degrees flexion, 40 degrees adduction, and 90  degrees internal rotation and 90 flexion, 70 degrees internal rotation.  I  was also able to flex her up to approximately 130 degrees.  We then removed  the trials and placed the permanent apex hole eliminator.  The permanent 28  mm neutral metal Ultramet liner was then  impacted into the acetabular shell.  This is a metal-on-metal hip replacement.  The 18D small sleeve is placed  with an 18 x 13 stem with 36+8 neck and 20 degrees of anteversion.  The 28+6  head is placed, and the hip is reduced with the same stability parameters.  The wound is copiously irrigated with saline solution, and the short  external rotators is reattached to the femur through drill holes with an  Ethibond suture.  The fascia lata is closed over a Hemovac drain with  interrupted #1 Vicryl, the subcu closed with #1 and 2-0 Vicryl, and  subcuticular running 4-0 Monocryl.  Approximately 20 cc of 0.25% Marcaine  with epi are injected into the subcutaneous tissues.  A bulky sterile  dressing is applied.  The drain is hooked to suction.  She is then put into  a knee immobilizer, awakened, and transported to recovery in stable  condition.     Drenda Freeze   FA/MEDQ  D:  11/24/2004  T:  11/24/2004  Job:  [id number]

## 2011-05-13 NOTE — Op Note (Signed)
NAMERHYA, SHAN NO.:  [id number]   MEDICAL RECORD NO.:  [id number]          PATIENT TYPE:  AMB   LOCATION:  ENDO                         FACILITY:  [facility]   PHYSICIAN:  Danise Edge, M.D.   DATE OF BIRTH:  10-Jun-1936   DATE OF PROCEDURE:  09/23/2004  DATE OF DISCHARGE:                                 OPERATIVE REPORT   PROCEDURE:  Screening colonoscopy.   INDICATIONS FOR PROCEDURE:  Ms. [name] is a 75 year old female born Jul 19, 1936.  Five years ago, she underwent a screening colonoscopy and a small  nonneoplastic polyp was removed from her colon.   ENDOSCOPIST:  Danise Edge, M.D.   PREMEDICATION:  Versed 7 mg, Demerol 70 mg.   DESCRIPTION OF PROCEDURE:  After obtaining informed consent, Ms. [name] was  placed in the left lateral decubitus position. I administered intravenous  Demerol and intravenous Versed to achieve conscious sedation for the  procedure. The patient's blood pressure, oxygen saturation and cardiac  rhythm were monitored throughout the procedure and documented in the medical  record.   Anal inspection and digital rectal exam were normal. The Olympus adjustable  pediatric colonoscope was introduced into the rectum and advanced to the  cecum. Colonic preparation for the exam today was excellent.   RECTUM:  Normal.   SIGMOID COLON AND DESCENDING COLON:  Normal.   SPLENIC FLEXURE:  Normal.   TRANSVERSE COLON:  Normal.   HEPATIC FLEXURE:  Normal.   ASCENDING COLON:  Normal.   CECUM AND ILEOCECAL VALVE:  Normal.   ASSESSMENT:  Normal screening proctocolonoscopy to the cecum. No endoscopic  evidence for the presence of colorectal neoplasia.   RECOMMENDATIONS:  Repeat colon polyp screen in 5-10 years.      MJ/MEDQ  D:  09/23/2004  T:  09/23/2004  Job:  [id number]   cc:   Georgann Housekeeper, M.D.  [address]  [place]  [place] [address]  Fax: [phone]

## 2011-05-13 NOTE — Discharge Summary (Signed)
NAMEABRIE, EGLOFF                  ACCOUNT NO.:  [id number]   MEDICAL RECORD NO.:  [id number]          PATIENT TYPE:  INP   LOCATION:  1514                         [facility]ACILITY:  [facility]   PHYSICIAN:  Ollen Gross, M.D.    DATE OF BIRTH:  11-Jul-1936   DATE OF ADMISSION:  09/11/2006  DATE OF DISCHARGE:  09/14/2006                                 DISCHARGE SUMMARY   CHIEF COMPLAINT:  1. Osteoarthritis, left knee.  2. Hypertension.  3. Hemorrhoids.  4. Occipital neuralgia secondary to aneurysm.  5. Rheumatoid arthritis.  6. Degenerative disk disease.  7. Osteoarthritis.  8. Carotid arterial disease.  9. Fibrocystic breast disease.  10.Postmenopausal.  11.Urinary incontinence.   DISCHARGE DIAGNOSES:  1. Osteoarthritis, left knee, status post left total knee arthroplasty.  2. Mild postoperative hyponatremia, improved.  3. Hypertension.  4. Hemorrhoids.  5. Occipital neuralgia secondary to aneurysm.  6. Rheumatoid arthritis.  7. Degenerative disk disease.  8. Osteoarthritis.  9. Coronary artery disease.  10.Fibrocystic breast disease.  11.Postmenopausal.  12.Urinary incontinence.   PROCEDURE:  September 11, 2006 - left total knee surgery, Dr. Lequita Halt.  Assistant - Avel Peace, P.A.-C.  Anesthesia - general.   CONSULTS:  Rehab services.   BRIEF HISTORY:  Ms. Kasparek is a 75 year old female with inflammatory  arthropathy and severe arthritis of the left knee with current effusions.  She has had 2 successful hip replacements and now presents for a left total  knee.   LABORATORY DATA:  Preoperative CBC 13.6, hematocrit 40.1, drifted down to  11.  Last noted hemoglobin and hematocrit 11.2 and 33.2.  PT and PTT on  admission 113.0 and 39 respectively.  INR of 1.0.  Serial pro times  followed.  PT INR 16.3 and 1.1.  Chem panel on admission revealed slightly  low sodium preoperative 133.  The remaining chem panel within normal limits.  Sodium did come up to 136.  Serial BMETs were  followed.  Sodium improved.  The remaining electrolytes remained within normal limits.  Preoperative  urinalysis negative.  Blood type A positive.   CHEST X-RAY:  A 2-view chest on September 06, 2006 revealed no acute  findings.   ELECTROCARDIOGRAM:  EKG on September 06, 2006 revealed sinus bradycardia,  minimal voltage criteria for LVH.  No significant change since last tracing  on September 06, 2006, interpreted Dr. Cassell Clement.   HOSPITAL COURSE:  Admitted to [facility], tolerated the procedure  well, later transferred to the recovery room and then orthopedic floor.  Started on PCA and p.o.  analgesics for pain control following surgery,  given 24 hours postoperative IV antibiotics.  Started on Coumadin for DVT  prophylaxis.  Did have some nausea the evening of surgery.  Was a little bit  better by the morning rounds on day #1.  Rehabilitation consultation was  called.  The patient was seen in consultation by rehabilitation services.  The patient was already doing so well with therapy, it was felt that she  would be able to go home.  There was some consideration that she lived by  herself and would possibly need some type of skilled facility for short  term.  She was also going to try to make arrangements with her sisters.  The  Hemovac drain came out on the evening of surgery.  By day #2, she was  already up out of bed with physical therapy and had been ambulating close to  200 feet.  Dressing was changed on day #2.  The incision looked good.  Continued to progress well.  Was doing so well she was ready to go home by  day #3 of September 14, 2006.  She had made arrangements with her sisters  and would be going home with them.   DISCHARGE PLAN:  1. The patient was discharged home on September 14, 2006.  2. Discharge diagnosis - please see above.  3. Discharge medications - Coumadin, Percocet, Robaxin.  4. Diet - resume home diet.  5. Activity - weight bear as  tolerated.  Total knee protocol.  6. Home PT and nursing.  7. Followup in 2 weeks.   DISPOSITION:  Home with sisters.   CONDITION ON DISCHARGE:  Improved.      Alexzandrew L. Julien Girt, P.A.      Ollen Gross, M.D.  Electronically Signed   ALP/MEDQ  D:  09/14/2006  T:  09/15/2006  Job:  [id number]   cc:   Georgann Housekeeper, MD  Fax: [phone]   Lemmie Evens, M.D.  Fax: [phone]

## 2011-05-13 NOTE — Op Note (Signed)
[name], [name] NO.:  [id number]   MEDICAL RECORD NO.:  [id number]          PATIENT TYPE:  INP   LOCATION:  0004                         FACILITY:  [facility]   PHYSICIAN:  Ollen Greene, M.D.    DATE OF BIRTH:  11/04/1936   DATE OF PROCEDURE:  02/14/2005  DATE OF DISCHARGE:                                 OPERATIVE REPORT   PREOPERATIVE DIAGNOSIS:  Osteoarthritis of the left hip.   POSTOPERATIVE DIAGNOSIS:  Osteoarthritis of the left hip.   PROCEDURE:  Left total hip arthroplasty, metal on metal.   SURGEON:  Ollen Greene, M.D.   ASSISTANT:  [name], P.A.   ANESTHESIA:  General.   ESTIMATED BLOOD LOSS:  800.   DRAINS:  Hemovac x1.   COMPLICATIONS:  None.   CONDITION:  Stable to the recovery room.   INDICATIONS FOR PROCEDURE:  [name] is a 75 year old female with severe  osteoarthritis of the left hip with intractable pain.  She had end-stage  change in both hips and had a very successful right total hip arthroplasty  approximately three months ago.  She has intractable pain on the left and  presents now for left total hip arthroplasty.   DESCRIPTION OF PROCEDURE:  After successful administration of general  anesthesia, the patient was placed in the right lateral decubitus position  with the left side up and held with the hip positioner.  The left lower  extremity was isolated from her perineum with plastic drapes and prepped and  draped in the usual sterile fashion.   A short posterolateral incision was then made with the 10 blade through the  subcutaneous tissue to the level of the fascia lata which was incised in  line with the skin incision.  The sciatic nerve was palpated and protected,  and the short rotator was isolated off of the femur.  Capsulectomy was  performed, and we noted a tremendous amount of synovitis in the joint.  The  hip was then dislocated and the center of the femoral head marked.  A trial  prosthesis was  placed such that the center of the trial head corresponded to  the center of the native femoral head.  The osteotomy line was marked on the  femoral neck, and an osteotomy was made with an oscillating saw.  The  femoral head was then removed.  The femur was retracted anteriorly to gain  acetabular exposure.   The labrum and a tremendous amount of hypertrophic synovium were removed.  The acetabular reaming began at 45 mm coursing in increments of 2 to 49, and  then a 50-mm Pinnacle acetabular shell was placed in anatomic position and  transfixed with two dome screws.  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  The trial 28-mm neutral liner  was then placed.   The femur was then prepared with the canal finder and irrigation.  Axial  reaming was performed to 13.5 mm, proximal  reaming to an 18D, and the sleeve  machine to a small.  The 18D small trial and sleeve was placed with an 18 x  13 stem and 36+8 neck.  Her native anteversion was only about 10 degrees, so  I placed an additional 10 degrees anteversion on the stem.  A 28+0 head was  placed.  With the +0, she had a small amount of soft tissue laxity, so we  went to a 28+6 which she had on the opposite side.  We achieved full  extension with full external rotation, 70 degrees flexion, 40 degrees  adduction, 90 degrees internal rotation, 90 degrees flexion, 70 degrees  internal rotation.  I was able to flex her up to about 130, and she was  still stable.  By placing the left leg on top of the right, the leg lengths  were found to be equal.  The hip was then dislocated and the trials removed.  The permanent apex hole eliminator was placed into the acetabular shell, and  a permanent 28-mm neutral Ultramet metal liner was placed.  This was a metal  on metal hip replacement.  The 18D small sleeve was then placed into the   proximal femur with an 18 x 13 stem and a 36+8 neck, once again, 10 degrees  beyond her native anteversion.  The 28+6 head was then placed, and the hip  was reduced to the same stability parameters.  The wound was copiously  irrigated with saline solution.  The short rotators were reattached to the  femur through drill holes.  The fascia lata was closed over a Hemovac drain  with interrupted #1 Vicryl.  The subcutaneous tissue was closed with #1 and  2-0 Vicryl and subcuticular running 4-0 Monocryl.  Marcaine 0.25%, 20 cc,  with epinephrine were injected into the subcutaneous tissues.  Steri-Strips  and a bulky sterile dressing were applied.  The drain was hooked to suction,  and she was placed into a knee immobilizer.   She was awakened and transported to recovery in stable condition.      FA/MEDQ  D:  02/14/2005  T:  02/14/2005  Job:  [id number]
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
## 2011-05-13 NOTE — H&P (Signed)
[name], HOESCHEN                  ACCOUNT NO.:  [id number]   MEDICAL RECORD NO.:  [id number]          PATIENT TYPE:  INP   LOCATION:  NA                           [facility]ACILITY:  [facility]   PHYSICIAN:  Ollen Gross, M.D.    DATE OF BIRTH:  1936-10-28   DATE OF ADMISSION:  09/11/2006  DATE OF DISCHARGE:                                HISTORY & PHYSICAL   CHIEF COMPLAINT:  Left knee pain.   HISTORY OF PRESENT ILLNESS:  The patient is a 75 year old female, well known  to Dr. Ollen Gross, having previously undergone a left total hip  arthroplasty back in February 2006.  Unfortunately she has continued to have  problems with her left knee.  She has been seen and treated conservatively  in the past for arthritis.  Unfortunately it has gotten to the point that it  is end-stage, and having significant pain.  She is having recurrent  effusions; it is felt she has reached a point where she could benefit  undergoing knee replacement.  The risks and benefits have been discussed,  and she elected to receive this surgery.   ALLERGIES:  NO KNOWN DRUG ALLERGIES.   INTOLERANCES:  ASPIRIN (causes increase in blood pressure).   CURRENT MEDICATIONS:  1. Hydrochlorothiazide 50 mg.  2. Prednisone 5 mg.  3. Folic acid 1 mg.  4. Hexaphenadine 60 mg.  5. Gabapentin 300 mg.  6. Nasonex 50 mcg.  7. PeriGuard 0.12%.  8. Clonidine 0.1 mg.  9. Detrol LA 4 mg.  10.Methatrexate 2.5 mg.   PAST MEDICAL HISTORY:  1. Hypertension.  2. Hemorrhoids.  3. Occipital neuralgia secondary to aneurysm.  4. Carotid arterial disease.  5. Degenerative disk disease.  6. Osteoarthritis.  7. Fibrocystic breast disease (post-menopausal).  8. Urinary incontinence.  9. Rheumatoid arthritis.   PAST SURGICAL HISTORY:  1. Tubal ligation.  2. Multiple breast biopsies, totally 5.  3. Nasal surgery.  4. Left knee arthroscopy.  5. Surgical fixation for aneurysm.  6. Dental implants.  7. Colonoscopy.  8. Right total hip  arthroplasty.  9. Left total hip arthroplasty.   SOCIAL HISTORY:  Divorced, retired.  Nonsmoker.  No alcohol.  Previous  smoker, but quit back in 1991.   FAMILY HISTORY:  Mother with a history of hypertension and arthritis.  Father with a history of arthritis, stroke.  Sister with a history of breast  cancer.  Another sister with lung cancer.  Two brothers, both deceased; both  had heart disease.   REVIEW OF SYSTEMS:  GENERAL:  No fevers, chills or night sweats.  NEUROLOGIC:  No seizure, syncope or paralysis.  RESPIRATORY:  No shortness  of breath, productive cough or hemoptysis.  CARDIOVASCULAR:  No chest pain,  angina or orthopnea.  GI:  No nausea, vomiting, diarrhea or constipation.  GU:  Some incontinence and frequency.  No dysuria or hematuria.  MUSCULOSKELETAL:  Left knee.   PHYSICAL EXAMINATION:  VITAL SIGNS:  Pulse 72, respirations 12, blood  pressure 128/74.  GENERAL:  A 75 year old white female, well nourished and well developed, in  no acute  distress.  She is alert, oriented and cooperative.  Good historian.  Very pleasant.  HEENT:  Normocephalic and atraumatic.  Pupils are equal, round and reactive.  Oropharynx clear.  EOMs intact.  Upper and lower dentures noted.  NECK:  Supple.  CHEST:  Clear, anterior and posterior chest walls.  HEART:  Regular rate and rhythm with a grade 2/6 faint early systolic  murmur.  ABDOMEN:  Soft and nontender; bowel sounds present.  RECTAL, BREASTS, GENITALIA:  Not done.  Not pertinent to present illness.  EXTREMITIES:  Left knee shows marked crepitus noted on passive range of  motion; tender laterally, no instability, positive for effusion.   IMPRESSION:  1. Osteoarthritis, left knee.  2. Hypertension.  3. Hemorrhoids.  4. Occipital neuralgia, secondary to aneurysm .  5. Rheumatoid arthritis.  6. Degenerative disk disease.  7. Osteoarthritis.  8. Carotid arterial disease.  9. Fibrocystic breast disease.  10.Post menopausal.   11.Urinary incontinence.   PLAN:  The patient admitted to [facility] to undergo a left total  knee arthroplasty.  The surgery will be performed by Dr. Ollen Gross.   PRIMARY CARE PHYSICIAN:  Georgann Housekeeper, MD.   RHEUMATOLOGIST:  Lemmie Evens, M.D.   Both of the above physicians will be notified of the patient's admission.  They may be consulted if needed for medical assistance with the patient  throughout the hospital course.      Alexzandrew L. Julien Girt, P.A.      Ollen Gross, M.D.  Electronically Signed    ALP/MEDQ  D:  09/10/2006  T:  09/10/2006  Job:  [id number]   cc:   Georgann Housekeeper, MD  Fax: [phone]   Lemmie Evens, M.D.  Fax: [phone]

## 2011-05-13 NOTE — Op Note (Signed)
NAMEFRANCELY, CRAW NO.:  [id number]   MEDICAL RECORD NO.:  [id number]          PATIENT TYPE:  INP   LOCATION:  0006                         FACILITY:  [facility]   PHYSICIAN:  Ollen Gross, M.D.    DATE OF BIRTH:  Mar 03, 1936   DATE OF PROCEDURE:  09/11/2006  DATE OF DISCHARGE:                                 OPERATIVE REPORT   PREOPERATIVE DIAGNOSIS:  Osteoarthritis left knee.   POSTOPERATIVE DIAGNOSIS:  Osteoarthritis left knee.   PROCEDURE:  Left total knee arthroplasty.   SURGEON:  Ollen Gross, M.D.   ASSISTANT:  Alexzandrew L. Julien Girt, P.A.   ANESTHESIA:  General with postop Marcaine pain pump.   ESTIMATED BLOOD LOSS:  Minimal.   DRAIN:  Hemovac x1.   TOURNIQUET TIME:  33 minutes at 300 mmHg.   COMPLICATIONS:  None.   CONDITION:  Stable to recovery.   BRIEF CLINICAL NOTE:  Ms. Deroo is a 75 year old female with inflammatory  arthropathy and severe arthritis of the left knee with recurrent effusions.  She has had 2 successful hip replacements and presents now for a left total  knee arthroplasty.   PROCEDURE IN DETAIL:  After the successful administration of general  anesthetic, a tourniquet is placed high on the left thigh and left lower  extremity prepped and draped in the usual sterile fashion.  The extremity is  wrapped in an Esmarch, the knee flexed, the tourniquet inflated to 300 mmHg.  A midline incision is made with a 10 blade through subcutaneous tissue to  the level of the extensor mechanism.  Given her preop valgus deformity, I  did a lateral parapatellar arthrotomy.  Soft tissue over the proximal  lateral tibia was then subperiosteally elevated around the joint line.  The  patella is then everted medially, knee flexed 90 degrees, ACL and PCL  removed.  A drill is used to create a starting hole and the distal femoral  canal is thoroughly irrigated.  A 5 degree left valgus alignment guide is  placed and referencing off the posterior  condyles, rotation is marked and  the block pinned to remove 10 mm off the distal femur.  Distal femoral  resection is made with an oscillating saw.  The sizing block is placed, size  3 is most appropriate.  Rotation is marked at the epicondylar axis.  A size  3 cutting block is placed in the anterior-posterior and chamfer cuts made.   Tibia subluxed forward and menisci removed.  Extramedullary tibial alignment  guide is placed referencing proximally at the medial aspect of the tibial  tubercle and distally along the second metatarsal axis and tibial crest.  The block is pinned to remove about 4 mm off the deficient lateral side.  Tibial resection is made with an oscillating saw.  A size 2.5 is the most  appropriate tibial component and the proximal tibia is prepared with the  modular drill and keel punch for a 2.5.  Femoral preparation is completed  with the intercondylar cut for size 3.   A Size 2.5 mobile bearing tibial trial, size 3  posterior stabilized femoral  trial, and a 10 mm posterior stabilized rotating platform insert trial are  placed.  With the 10, full extension is achieved with excellent varus and  valgus balance throughout full range of motion.  The patella is then everted  medially and thickness measured to be 20 mm.  Freehand resection is taken to  12 mm, a 35 template is placed, lug holes are drilled, trial patella is  placed, and  it tracks normally.  Posterior femur is inspected and there are  no osteophytes.   Trials removed and the cut bone surfaces prepared with pulsatile lavage.  Cement is mixed and once ready for implantation, a size 2.5 mobile bearing  tibial tray, size 3 posterior stabilized femur and 35 patella are cemented  into place and then the patella is held with a the clamp.  Trial 10-mm  insert is placed and the knee held in full extension and all extruded cement  removed.  Once cement is fully hardened, then the permanent 10 mm posterior   stabilized rotating platform insert is placed into the tibial tray.  The  wound is copiously irrigated with saline solution and the tourniquet then  released with a total time of 33 minutes.  Minor bleeding is stopped with  electrocautery.  The extensor mechanism is closed with interrupted #1 PDS  over a Hemovac drain.  I left open a small area from the superior to  inferior pole of the patella to serve as a mini lateral release.  The subcu  tissue was then closed with interrupted 2-0 Vicryl.  Flexion against gravity  was 140 degrees.  The subcuticular was closed running 4-0 Monocryl.  The  catheter for the Marcaine pain pump is placed and the pump is initiated.  The Hemovac drain is hooked to suction.  Steri-Strips and a bulky sterile  dressing are applied and the left lower extremity placed into knee  immobilizer and she is awakened and transported to recovery in stable  condition.      Ollen Gross, M.D.  Electronically Signed     FA/MEDQ  D:  09/11/2006  T:  09/12/2006  Job:  [id number]

## 2011-11-01 ENCOUNTER — Other Ambulatory Visit: Payer: Self-pay | Admitting: Internal Medicine

## 2011-11-01 DIAGNOSIS — Z1231 Encounter for screening mammogram for malignant neoplasm of breast: Secondary | ICD-10-CM

## 2011-12-06 ENCOUNTER — Ambulatory Visit
Admission: RE | Admit: 2011-12-06 | Discharge: 2011-12-06 | Disposition: A | Discharge status: Home / Self Care | Payer: Medicare Other | Source: Ambulatory Visit | Attending: Internal Medicine | Admitting: Internal Medicine

## 2011-12-06 DIAGNOSIS — Z1231 Encounter for screening mammogram for malignant neoplasm of breast: Secondary | ICD-10-CM

## 2012-02-06 ENCOUNTER — Encounter: Payer: Self-pay | Admitting: Vascular Surgery

## 2012-02-07 ENCOUNTER — Ambulatory Visit (INDEPENDENT_AMBULATORY_CARE_PROVIDER_SITE_OTHER): Payer: Medicare Other | Admitting: Vascular Surgery

## 2012-02-07 ENCOUNTER — Encounter: Payer: Self-pay | Admitting: Vascular Surgery

## 2012-02-07 VITALS — BP 148/82 | HR 65 | Resp 18 | Ht 65.0 in | Wt 128.0 lb

## 2012-02-07 DIAGNOSIS — I83893 Varicose veins of bilateral lower extremities with other complications: Secondary | ICD-10-CM

## 2012-02-07 DIAGNOSIS — M7989 Other specified soft tissue disorders: Secondary | ICD-10-CM

## 2012-02-07 NOTE — Progress Notes (Signed)
Subjective:     Patient ID: [name], female   DOB: 1935/12/27, 76 y.o.   MRN: [id number]  HPI this 76 year old female was referred by Dr. Eula Listen for right leg pain possible venous disease. This patient has had previous sclerotherapy and both legs and a laser treatment in the left posterior calf by Dr. Raechel Chute [facility]. Recently she has had some discomfort in the right lower leg between the knee and the ankle. This usually occurs at night when she is in bed. She is able to ambulate long distances and exercise without difficulty. She has no history of DVT, thrombophlebitis, stasis ulcers, or severe edema. She has no symptoms in the contralateral left leg. She does wear elastic compression stockings which does improve the way her right leg feels.  Past Medical History  Diagnosis Date  . Varicose veins     History  Substance Use Topics  . Smoking status: Former Smoker    Quit date: 07/26/1990  . Smokeless tobacco: Not on file  . Alcohol Use: No    History reviewed. No pertinent family history.  No Known Allergies  Current outpatient prescriptions:aspirin 81 MG tablet, Take 81 mg by mouth daily., Disp: , Rfl: ;  Calcium-Vitamin D-Vitamin K (VIACTIV) 500-100-40 MG-UNT-MCG CHEW, Chew by mouth., Disp: , Rfl: ;  cloNIDine (CATAPRES) 0.1 MG tablet, Take 0.1 mg by mouth 2 (two) times daily., Disp: , Rfl: ;  fexofenadine (ALLEGRA) 60 MG tablet, Take 60 mg by mouth daily., Disp: , Rfl:  gabapentin (NEURONTIN) 300 MG capsule, Take 300 mg by mouth 3 (three) times daily., Disp: , Rfl: ;  hydrochlorothiazide (HYDRODIURIL) 50 MG tablet, Take 50 mg by mouth daily., Disp: , Rfl: ;  leflunomide (ARAVA) 20 MG tablet, Take 20 mg by mouth daily., Disp: , Rfl: ;  levothyroxine (SYNTHROID, LEVOTHROID) 25 MCG tablet, Take 25 mcg by mouth daily., Disp: , Rfl:   BP 148/82  Pulse 65  Resp 18  Ht 5\' 5"  (1.651 m)  Wt 128 lb (58.06 kg)  BMI 21.30 kg/m2  Body mass index is 21.30 kg/(m^2).        Review  of Systems denies chest pain, dyspnea on exertion, PND, orthopnea, hemoptysis, or location. Has had left knee replacement and bilateral hip replacement doing well at this time no other systems are positive    Objective:   Physical Exam pressure 140/82 heart rate 65 respirations 18 Gen.-alert and oriented x3 in no apparent distress HEENT normal for age Lungs no rhonchi or wheezing Cardiovascular regular rhythm no murmurs carotid pulses 3+ palpable no bruits audible Abdomen soft nontender no palpable masses Musculoskeletal free of  major deformities Skin clear -no rashes Neurologic normal Lower extremities 3+ femoral and dorsalis pedis pulses palpable bilaterally with no edema no bulging varicosities are noted. A few small spider or reticular veins are present in the right lower leg between the knee and ankle.   Today I ordered a bilateral venous duplex exam which I reviewed and interpreted. There is no DVT. Both great saphenous veins are patent with a few small areas were segments of reflux but not a large vein. Small saphenous veins are normal.    Assessment:     Right leg discomfort-not do to arterial or venous insufficiency-etiology unknown    Plan:     No need for further arterial or venous evaluation. Recommend short-leg elastic compression stockings which seemed to help her symptoms

## 2012-02-20 NOTE — Procedures (Unsigned)
LOWER EXTREMITY VENOUS REFLUX EXAM  INDICATION:  Bilateral lower extremity swelling and varicose veins.  EXAM:  Using color-flow imaging and pulse Doppler spectral analysis, the bilateral common femoral, superficial femoral, popliteal, posterior tibial, greater and lesser saphenous veins are evaluated.  There is evidence suggesting deep venous insufficiency in the left lower extremity.  There is no evidence suggesting deep venous insufficiency in the right lower extremity.  The bilateral saphenofemoral junctions are competent. The bilateral GSV's are not competent with reflux of >577milliseconds with the caliber as described below.   The bilateral proximal short saphenous veins demonstrate competency.  GSV Diameter (used if found to be incompetent only)                                                      Right      Left Proximal Greater Saphenous Vein                      0.51 cm    0.49 cm Proximal-to-mid-thigh                                0.40 cm    0.40 cm Mid thigh                                            0.39 cm    0.39 cm Mid-distal thigh                                     cm         cm Distal thigh                                         0.38 cm    0.37 cm Knee                                                 0.35 cm    0.40 cm  IMPRESSION: 1. The bilateral great saphenous veins are not competent with reflux     >548milliseconds. 2. The bilateral greater saphenous veins are not tortuous. 3. The deep venous system of the right lower extremity is competent. 4. The deep venous system of the left lower extremity is not competent     with reflux >500 milliseconds. 5. The bilateral small saphenous veins are competent. 6. Competent perforator veins are present, as noted on worksheet.  ___________________________________________ Quita Skye. Hart Rochester, M.D.  SH/MEDQ  D:  02/07/2012  T:  02/07/2012  Job:  [id number]

## 2012-06-04 ENCOUNTER — Other Ambulatory Visit: Payer: Self-pay | Admitting: Internal Medicine

## 2012-06-04 DIAGNOSIS — I720 Aneurysm of carotid artery: Secondary | ICD-10-CM

## 2012-06-08 ENCOUNTER — Other Ambulatory Visit: Payer: Medicare Other

## 2012-06-11 ENCOUNTER — Ambulatory Visit
Admission: RE | Admit: 2012-06-11 | Discharge: 2012-06-11 | Disposition: A | Discharge status: Home / Self Care | Payer: Medicare Other | Source: Ambulatory Visit | Attending: Internal Medicine | Admitting: Internal Medicine

## 2012-06-11 DIAGNOSIS — I720 Aneurysm of carotid artery: Secondary | ICD-10-CM

## 2012-06-13 ENCOUNTER — Other Ambulatory Visit: Payer: Self-pay | Admitting: Internal Medicine

## 2012-06-13 DIAGNOSIS — I720 Aneurysm of carotid artery: Secondary | ICD-10-CM

## 2012-06-18 ENCOUNTER — Ambulatory Visit
Admission: RE | Admit: 2012-06-18 | Discharge: 2012-06-18 | Disposition: A | Discharge status: Home / Self Care | Payer: Medicare Other | Source: Ambulatory Visit | Attending: Internal Medicine | Admitting: Internal Medicine

## 2012-06-18 DIAGNOSIS — I720 Aneurysm of carotid artery: Secondary | ICD-10-CM

## 2012-10-29 ENCOUNTER — Other Ambulatory Visit: Payer: Self-pay | Admitting: Internal Medicine

## 2012-10-29 DIAGNOSIS — Z1231 Encounter for screening mammogram for malignant neoplasm of breast: Secondary | ICD-10-CM

## 2012-12-06 ENCOUNTER — Ambulatory Visit: Payer: Medicare Other

## 2012-12-20 ENCOUNTER — Ambulatory Visit
Admission: RE | Admit: 2012-12-20 | Discharge: 2012-12-20 | Disposition: A | Discharge status: Home / Self Care | Payer: Medicare Other | Source: Ambulatory Visit | Attending: Internal Medicine | Admitting: Internal Medicine

## 2012-12-20 DIAGNOSIS — Z1231 Encounter for screening mammogram for malignant neoplasm of breast: Secondary | ICD-10-CM

## 2013-11-14 ENCOUNTER — Other Ambulatory Visit: Payer: Self-pay

## 2013-11-14 DIAGNOSIS — Z1231 Encounter for screening mammogram for malignant neoplasm of breast: Secondary | ICD-10-CM

## 2013-12-24 ENCOUNTER — Ambulatory Visit
Admission: RE | Admit: 2013-12-24 | Discharge: 2013-12-24 | Disposition: A | Discharge status: Home / Self Care | Payer: Medicare Other | Source: Ambulatory Visit

## 2013-12-24 DIAGNOSIS — Z1231 Encounter for screening mammogram for malignant neoplasm of breast: Secondary | ICD-10-CM

## 2014-06-10 ENCOUNTER — Other Ambulatory Visit: Payer: Self-pay | Admitting: Internal Medicine

## 2014-06-10 DIAGNOSIS — K228 Other specified diseases of esophagus: Secondary | ICD-10-CM

## 2014-06-10 DIAGNOSIS — K2289 Other specified disease of esophagus: Secondary | ICD-10-CM

## 2014-06-13 ENCOUNTER — Ambulatory Visit
Admission: RE | Admit: 2014-06-13 | Discharge: 2014-06-13 | Disposition: A | Discharge status: Home / Self Care | Payer: Medicare Other | Source: Ambulatory Visit | Attending: Internal Medicine | Admitting: Internal Medicine

## 2014-06-13 DIAGNOSIS — K2289 Other specified disease of esophagus: Secondary | ICD-10-CM

## 2014-06-13 DIAGNOSIS — K228 Other specified diseases of esophagus: Secondary | ICD-10-CM

## 2014-10-16 ENCOUNTER — Ambulatory Visit
Admission: RE | Admit: 2014-10-16 | Discharge: 2014-10-16 | Disposition: A | Discharge status: Home / Self Care | Payer: Medicare Other | Source: Ambulatory Visit | Attending: Nurse Practitioner | Admitting: Nurse Practitioner

## 2014-10-16 ENCOUNTER — Other Ambulatory Visit: Payer: Self-pay | Admitting: Nurse Practitioner

## 2014-10-16 DIAGNOSIS — M546 Pain in thoracic spine: Secondary | ICD-10-CM

## 2014-12-01 ENCOUNTER — Other Ambulatory Visit: Payer: Self-pay

## 2014-12-01 DIAGNOSIS — Z1231 Encounter for screening mammogram for malignant neoplasm of breast: Secondary | ICD-10-CM

## 2014-12-25 ENCOUNTER — Encounter (INDEPENDENT_AMBULATORY_CARE_PROVIDER_SITE_OTHER): Payer: Self-pay

## 2014-12-25 ENCOUNTER — Ambulatory Visit
Admission: RE | Admit: 2014-12-25 | Discharge: 2014-12-25 | Disposition: A | Discharge status: Home / Self Care | Payer: Medicare Other | Source: Ambulatory Visit

## 2014-12-25 DIAGNOSIS — Z1231 Encounter for screening mammogram for malignant neoplasm of breast: Secondary | ICD-10-CM

## 2015-11-16 ENCOUNTER — Other Ambulatory Visit: Payer: Self-pay

## 2015-11-16 DIAGNOSIS — Z1231 Encounter for screening mammogram for malignant neoplasm of breast: Secondary | ICD-10-CM

## 2015-12-29 ENCOUNTER — Ambulatory Visit
Admission: RE | Admit: 2015-12-29 | Discharge: 2015-12-29 | Disposition: A | Discharge status: Home / Self Care | Payer: Medicare Other | Source: Ambulatory Visit

## 2015-12-29 DIAGNOSIS — Z1231 Encounter for screening mammogram for malignant neoplasm of breast: Secondary | ICD-10-CM

## 2016-01-03 ENCOUNTER — Emergency Department (HOSPITAL_COMMUNITY)
Admission: EM | Admit: 2016-01-03 | Discharge: 2016-01-27 | Disposition: E | Payer: Medicare Other | Attending: Emergency Medicine | Admitting: Emergency Medicine

## 2016-01-03 DIAGNOSIS — I469 Cardiac arrest, cause unspecified: Secondary | ICD-10-CM | POA: Diagnosis not present

## 2016-01-03 DIAGNOSIS — R079 Chest pain, unspecified: Secondary | ICD-10-CM | POA: Diagnosis present

## 2016-01-03 MED ORDER — EPINEPHRINE HCL 0.1 MG/ML IJ SOSY
PREFILLED_SYRINGE | INTRAMUSCULAR | Status: AC | PRN
  Administered 2016-01-03 (×2): 1 mg via INTRAVENOUS

## 2016-01-27 NOTE — Progress Notes (Addendum)
Chaplain spoke with sister Billie LadeDeaner Lewis [facility] and adult son Clemon ChambersDennis Klecka [phone]).  Another sister in the area is Rayann HemanShirley Smith.  Both sisters are seeking transportation options because they are snowed in.  [place]. Family said they think it is a heart attack but would like more information.  Will contact RN or Doc to see if there is more information that can be shared at this time.  Please call as needed.  Theodoro Parmaalacios, Clotee Schlicker N, Chaplain [phone]  Chaplain was contacted by niece Clementeen HoofDonna Skeens at 3:42 PM via hospital operator. [place] from [place] and would [place] 20 minutes.  She asked for medical condition, but chaplain said she didn't have medical information at this time but that it was important for family to come.

## 2016-01-27 NOTE — Progress Notes (Signed)
Rt responded to CPR in Progress coming in from [facility] to ED D36. Pt intubated prior to arrival in ED with [facility]. Pt being manually ventilated throughout duration of code without difficulty. Pt had vomit in mouth and around nose. Pt never regained pulse and cpr was terminated per MD. [facility] airway removed per instruction of ED MD with large amount of tan liquid/vomit suctioned out after.

## 2016-01-27 NOTE — ED Notes (Signed)
Contacted next of kin, dennis Boorman and dr Ryland Groupyelverton speaking with him on phone.

## 2016-01-27 NOTE — ED Notes (Signed)
The Chaplain has contacted the pts son Clemon ChambersDennis Dancy the son & requested the pt to come to ED, Ranae PalmsYelverton, MD provided son's contact info to give further update, pts niece is in route to hospital, pts sisters were contacted by the chaplain

## 2016-01-27 NOTE — ED Notes (Addendum)
Niece Marilynne DriversDonna Skeen and husband arrived at approximately 4:25 PM.  After MD spoke with son on the phone to inform of patient death, MD informed niece and her husband of patient death.  Chaplain assisted family by orienting them to the area, providing emotional support and escorting them to view the body.  Chaplain gave patient belongings to niece per RN Toniann FailWendy and provided niece's name to [employer] [place].  Chaplain called son Maurine MinisterDennis (only child), [place]had not [place] yet due to car trouble).  Noted son's address ([address], [place], [place] [id number]) for RN.  On the phone, son was very teary. Chaplain provided emotional and spiritual support, suggested bereavement resources and communicated next steps to son, including providing him with the phone number for [employer].    At 5:08 PM, shortly after niece and husband left, the youth pastor of the church where sisters Leonides SakeDiener and Talbert ForestShirley [facility] attend arrived to offer help.  With permission from family, chaplain informed pastor of patient death and escorted pastor to room where pastor offered prayer bedside.  Sister Leonides SakeDiener thinks patient has pre[facility]arrangements at [facility] [employer] [facility] [place] and asked if chaplain would confirm. Chaplain did call and [facility] [employer] [facility] does not have patient's records. Chaplain informed sister and offered resources for how family can locate funeral home information during normal business hours.   Sister noted that patient does own a plot on [address]  Family expressed appreciation for support and indicated that no other family members will be coming to the hospital.  Belia Hemanalacios, Aleaha Fickling N, [place] [phone]

## 2016-01-27 NOTE — ED Provider Notes (Signed)
CSN: [id number]     Arrival date & time 01/23/16  1523 History   First MD Initiated Contact with Patient 01/23/16 1533     No chief complaint on file. CC: Cardiac Arrest   (Consider location/radiation/quality/duration/timing/severity/associated sxs/prior Treatment) HPI Comments: Level 5 exception of care due to acuity of condition  Patient is a 80 y.o. female presenting with general illness. The history is provided by the EMS personnel.  Illness Location:  At home Quality:  Found down Severity:  Severe Onset quality:  Sudden Duration:  2 hours Timing:  Constant Progression:  Unchanged Chronicity:  New Context:  Pt was shoveling snow and found down outside with a 30 minute window where she was not seen Ineffective treatments:  Pt received multiple rounds of epinephrine, amiodarone, defibrillation with ROSC obtained several times which was not sustained.   No past medical history on file. No past surgical history on file. No family history on file. Social History  Substance Use Topics  . Smoking status: Not on file  . Smokeless tobacco: Not on file  . Alcohol Use: Not on file   OB History    No data available     Review of Systems  Unable to perform ROS: Patient unresponsive      Allergies  Review of patient's allergies indicates not on file.  Home Medications   Prior to Admission medications   Not on File   There were no vitals taken for this visit. Physical Exam  Constitutional: She appears well-developed and well-nourished. She has a sickly appearance. She appears ill. She is intubated.  HENT:  Head: Head is without abrasion, without contusion and without laceration.  Right Ear: No hemotympanum.  Left Ear: No hemotympanum.  Mouth/Throat:    Eyes: Conjunctivae are normal. Right pupil is not reactive. Left pupil is not reactive (Bilateral fixed and dilated pupils).  Neck: Normal range of motion.  Cardiovascular:  Pt in asystole throughout time in  emergency department  Pulmonary/Chest: She is intubated. She has no decreased breath sounds.  Abdominal: Normal appearance. She exhibits no distension and no mass. There is no rigidity and no guarding. No hernia.  Neurological: She is unresponsive. GCS eye subscore is 1. GCS verbal subscore is 1. GCS motor subscore is 1.  Skin: No abrasion, no bruising, no burn, no ecchymosis and no rash noted. No erythema.    ED Course  .Critical Care Performed by: Stacy Gardner Authorized by: Stacy Gardner Total critical care time: 35 minutes Critical care was necessary to treat or prevent imminent or life-threatening deterioration of the following conditions: cardiac failure. Critical care was time spent personally by me on the following activities: blood draw for specimens, interpretation of cardiac output measurements, evaluation of patient's response to treatment, examination of patient, obtaining history from patient or surrogate, ordering and performing treatments and interventions, pulse oximetry, re-evaluation of patient's condition and review of old charts.   (including critical care time) Labs Review Labs Reviewed - No data to display  Imaging Review No results found. I have personally reviewed and evaluated these images and lab results as part of my medical decision-making.   EKG Interpretation None      MDM   Final diagnoses:  Cardiac arrest [facility])    80 year old Caucasian female with unknown past medical history presents in setting of cardiac arrest. Per EMS report patient was outside shoveling snow and was unwitnessed for 30 minutes. Patient was found down and EMS was called. On arrival of fire team CPR was  initiated and patient received multiple defibrillations without return of spontaneous circulation. Throughout the time EMS was at scene patient received numerous rounds of epinephrine and 300 of amiodarone. Patient received multiple defibrillations with intermittent return of  spontaneous circulation for very short time. Patient was intermittently in V. tach and V. fib. Patient had [facility] placed with bilateral breath sounds. CPR was continued intermittently throughout time patient was taking care of. On arrival in emergency department patient had been down for approximately 2 hours with no neurologic function during this time.  On arrival patient's pupils were fixed and dilated and she was not making any movements. Patient had no gag reflex. She did have emesis in mouth. Patient had bilateral breath sounds with King airway in place. On initial rhythm check patient was in asystole. Patient given multiple rounds of epinephrine and at repeat pulse checks continued to be in asystole. Patient had a rectal temperature obtained which was within normal limits. In setting of normothermia and cardiac arrest for approximately 2 hours at that time it was determined the patient had an uncertain arrival injury and time of death was called.  Attending has seen and evaluated patient and Dr. Ranae PalmsYelverton is in agreement with plan.    Stacy GardnerAndrew Bethanie Bloxom, MD 11-22-2016 1654  Loren Raceravid Yelverton, MD 11-22-2016 [phone]

## 2016-01-27 NOTE — ED Notes (Signed)
[facility], per report pt was witnessed to be last seen normal shoveling snow at home @ 13:00, pt found unresponsive in the snow at 13:30, pt shocked x 2 by AED by Fire Dept, pt received total of x8 epi, 300 mg Amiodarone, & was shocked x 2 by EMS d/t V fib rhythm, pt had 1 episode of ROSC lasting an estimated 2 mins with return of PEA, total CPR time estimated to be 45 mins, pt unresponsive on [place] with [facility], LSB, & head blocks

## 2016-01-27 DEATH — deceased
